# Patient Record
Sex: Male | Born: 1985 | Race: Black or African American | Hispanic: No | Marital: Single | Smoking: Never smoker
Health system: Southern US, Community
[De-identification: ages and names within clinical notes are randomized; demographics above are authoritative.]

---

## 2005-08-13 ENCOUNTER — Emergency Department: Payer: Self-pay | Admitting: Emergency Medicine

## 2012-04-02 ENCOUNTER — Emergency Department: Payer: Self-pay | Admitting: Emergency Medicine

## 2012-09-29 IMAGING — CR DG ANKLE COMPLETE 3+V*L*
1 series · 5 of 5 positions shown · non-contrast
Comparison: none

REASON FOR EXAM: pain, swelling s/p fall
COMMENTS:

PROCEDURE:     DXR - DXR ANKLE LEFT COMPLETE  - April 02, 2012  [DATE]
RESULT:     Five views of the left ankle reveal the joint mortise to be
preserved. The talar dome is intact. There is mild diffuse soft tissue
swelling. No malleolar fracture is demonstrated.

[Series 1: x ankle ap left · 0.14mm/px · 5 of 5 slices shown]
[im 1/5]
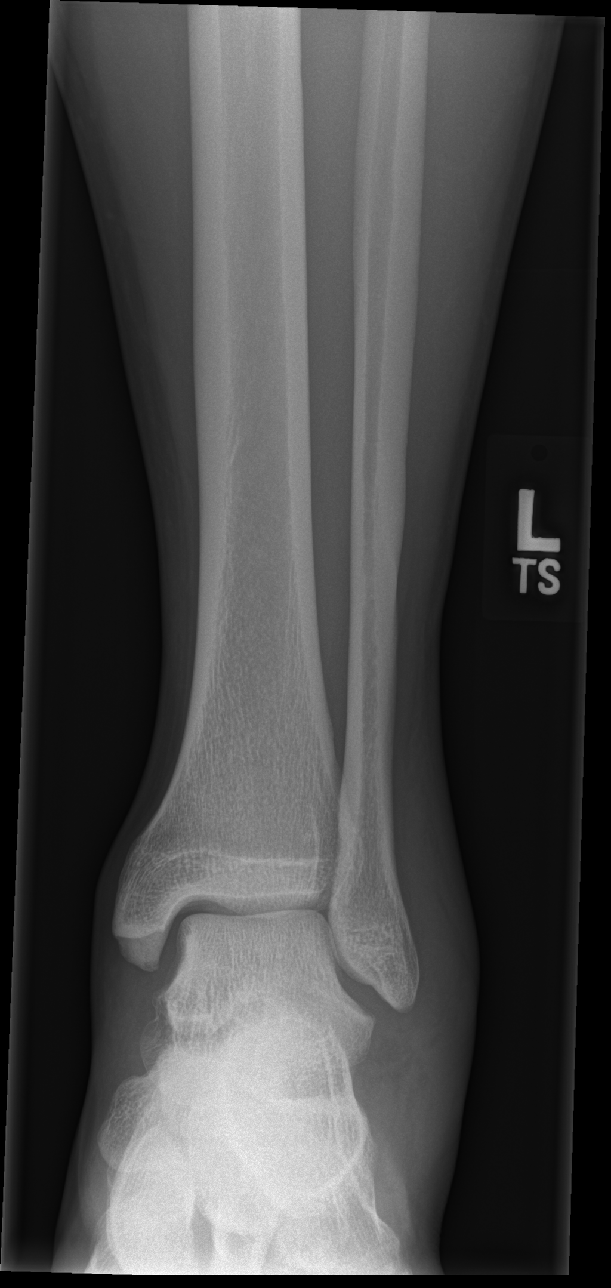
[im 2/5]
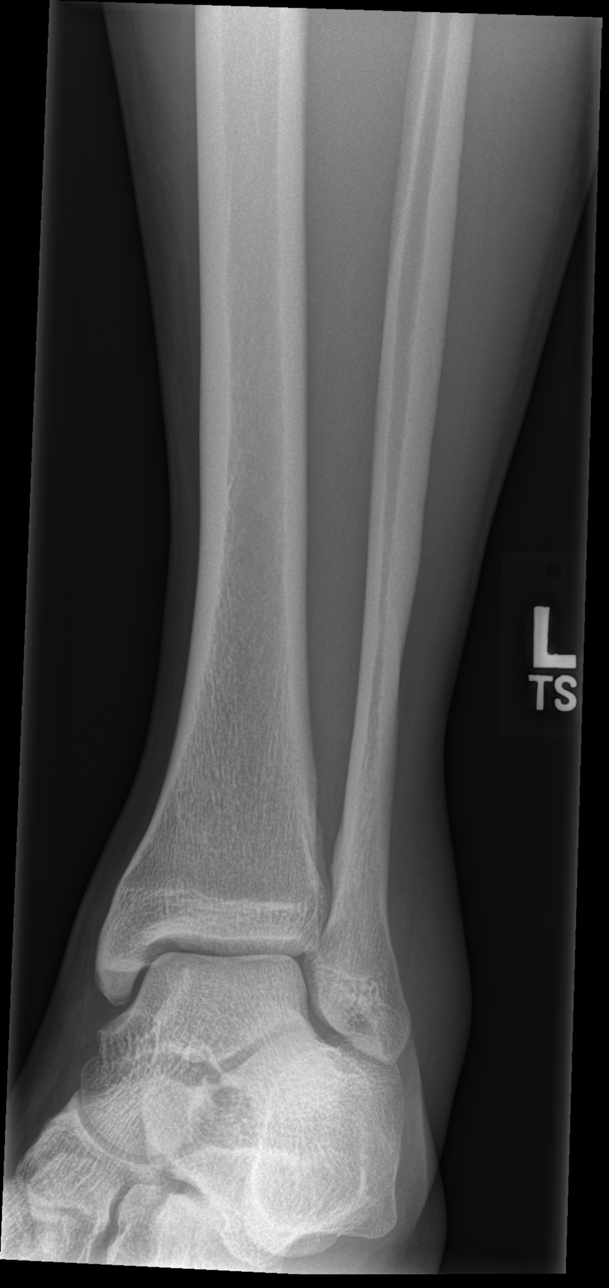
[im 3/5]
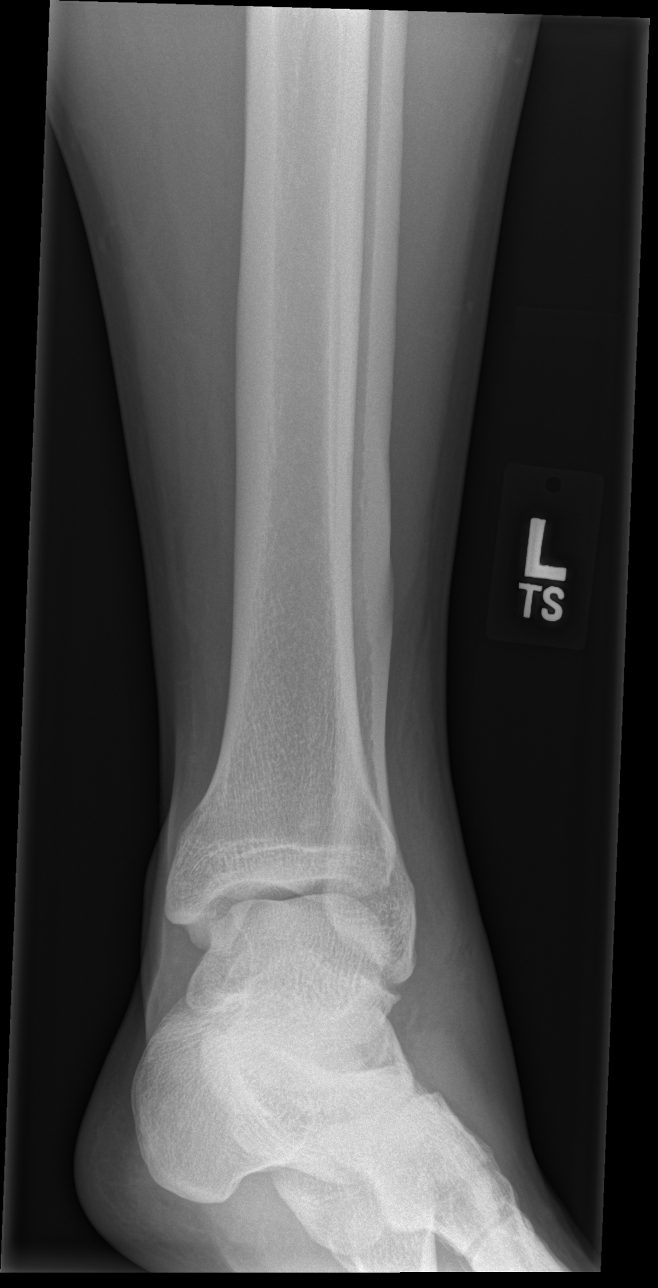
[im 4/5]
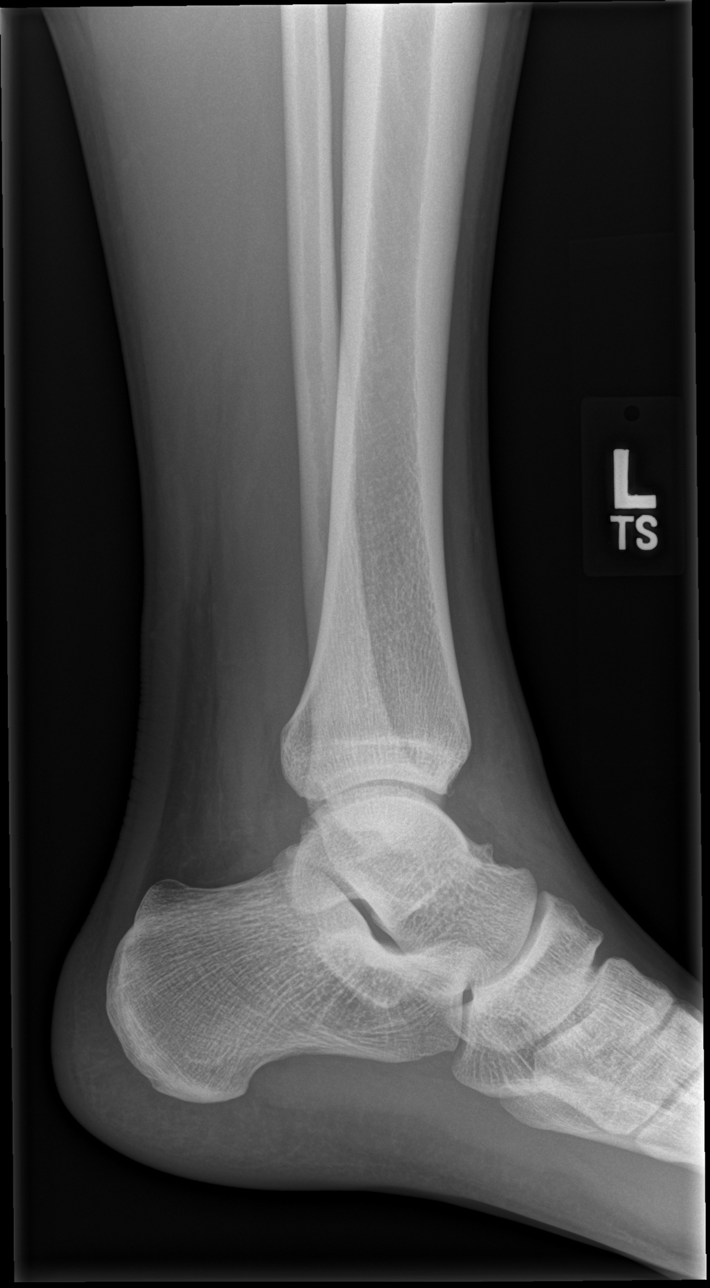
[im 5/5]
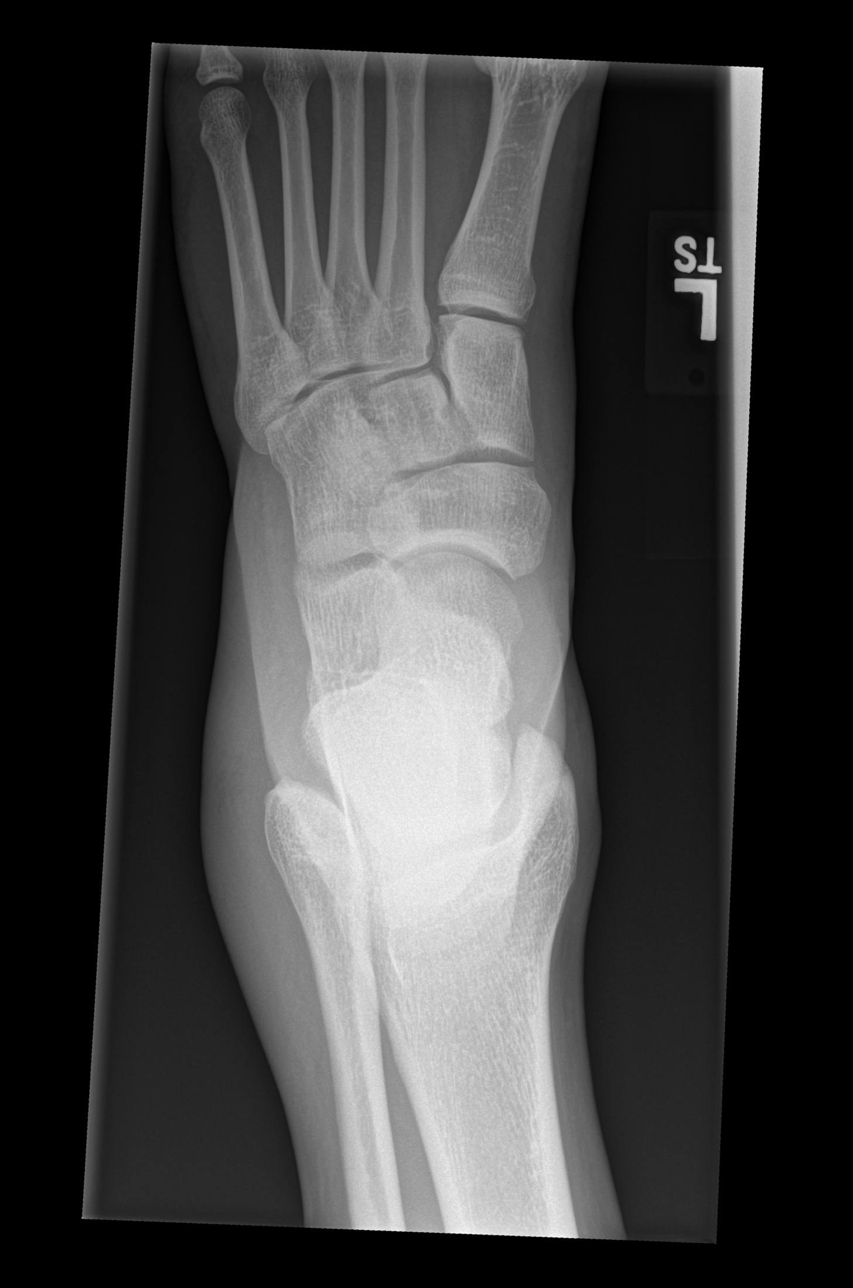

[5 of 5 positions shown; findings below may reference images not displayed]

IMPRESSION: There is soft tissue swelling over the ankle. There is no
evidence of an acute fracture. Followup imaging is available if the
patient's symptoms do not resolve in a fashion consistent with an
uncomplicated sprain or contusion.

## 2012-10-07 ENCOUNTER — Emergency Department: Payer: Self-pay | Admitting: Internal Medicine

## 2012-10-07 LAB — URINALYSIS, COMPLETE
Bilirubin,UR: NEGATIVE
Glucose,UR: NEGATIVE mg/dL (ref 0–75)
Ketone: NEGATIVE
Ph: 6 (ref 4.5–8.0)
RBC,UR: 1 /HPF (ref 0–5)
Specific Gravity: 1.017 (ref 1.003–1.030)
Squamous Epithelial: NONE SEEN
WBC UR: <1 /HPF (ref 0–5)

## 2012-10-11 ENCOUNTER — Emergency Department: Payer: Self-pay | Admitting: Emergency Medicine

## 2022-12-07 ENCOUNTER — Emergency Department
Admission: EM | Admit: 2022-12-07 | Discharge: 2022-12-07 | Disposition: A | Discharge status: Home / Self Care | Payer: BC Managed Care – PPO | Attending: Student in an Organized Health Care Education/Training Program | Admitting: Student in an Organized Health Care Education/Training Program

## 2022-12-07 ENCOUNTER — Ambulatory Visit
Admission: EM | Admit: 2022-12-07 | Discharge: 2022-12-07 | Disposition: A | Discharge status: Home / Self Care | Payer: BC Managed Care – PPO | Attending: Urgent Care | Admitting: Urgent Care

## 2022-12-07 ENCOUNTER — Other Ambulatory Visit: Payer: Self-pay

## 2022-12-07 ENCOUNTER — Emergency Department: Payer: BC Managed Care – PPO

## 2022-12-07 DIAGNOSIS — X500XXA Overexertion from strenuous movement or load, initial encounter: Secondary | ICD-10-CM | POA: Insufficient documentation

## 2022-12-07 DIAGNOSIS — S63501A Unspecified sprain of right wrist, initial encounter: Secondary | ICD-10-CM

## 2022-12-07 DIAGNOSIS — I161 Hypertensive emergency: Secondary | ICD-10-CM

## 2022-12-07 DIAGNOSIS — M25531 Pain in right wrist: Secondary | ICD-10-CM | POA: Insufficient documentation

## 2022-12-07 DIAGNOSIS — I10 Essential (primary) hypertension: Secondary | ICD-10-CM | POA: Insufficient documentation

## 2022-12-07 LAB — CBC
HCT: 46.7 % (ref 39.0–52.0)
Hemoglobin: 15.9 g/dL (ref 13.0–17.0)
MCH: 28.9 pg (ref 26.0–34.0)
MCHC: 34 g/dL (ref 30.0–36.0)
MCV: 84.9 fL (ref 80.0–100.0)
Platelets: 273 10*3/uL (ref 150–400)
RBC: 5.5 MIL/uL (ref 4.22–5.81)
RDW: 12.5 % (ref 11.5–15.5)
WBC: 6.1 10*3/uL (ref 4.0–10.5)
nRBC: 0 % (ref 0.0–0.2)

## 2022-12-07 LAB — COMPREHENSIVE METABOLIC PANEL
ALT: 60 U/L — ABNORMAL HIGH (ref 0–44)
AST: 32 U/L (ref 15–41)
Albumin: 4.4 g/dL (ref 3.5–5.0)
Alkaline Phosphatase: 57 U/L (ref 38–126)
Anion gap: 10 (ref 5–15)
BUN: 20 mg/dL (ref 6–20)
CO2: 21 mmol/L — ABNORMAL LOW (ref 22–32)
Calcium: 8.9 mg/dL (ref 8.9–10.3)
Chloride: 105 mmol/L (ref 98–111)
Creatinine, Ser: 1.38 mg/dL — ABNORMAL HIGH (ref 0.61–1.24)
GFR, Estimated: >60 mL/min (ref >60)
Glucose, Bld: 115 mg/dL — ABNORMAL HIGH (ref 70–99)
Potassium: 3.7 mmol/L (ref 3.5–5.1)
Sodium: 136 mmol/L (ref 135–145)
Total Bilirubin: 0.8 mg/dL (ref 0.3–1.2)
Total Protein: 8.2 g/dL — ABNORMAL HIGH (ref 6.5–8.1)

## 2022-12-07 LAB — TROPONIN I (HIGH SENSITIVITY): Troponin I (High Sensitivity): 6 ng/L (ref <18)

## 2022-12-07 MED ORDER — AMLODIPINE BESYLATE 5 MG PO TABS: 5.0000 mg | ORAL_TABLET | Freq: Every day | ORAL | 11 refills | Status: AC

## 2022-12-07 MED ORDER — OXYCODONE-ACETAMINOPHEN 5-325 MG PO TABS: 1.0000 | ORAL_TABLET | ORAL | 0 refills | Status: AC | PRN

## 2022-12-07 MED ORDER — ACETAMINOPHEN 500 MG PO TABS
1000.0000 mg | ORAL_TABLET | Freq: Once | ORAL | Status: AC
  Administered 2022-12-07: 1000 mg via ORAL
  Filled 2022-12-07: qty 2

## 2022-12-07 MED ORDER — CLONIDINE HCL 0.1 MG PO TABS
0.2000 mg | ORAL_TABLET | Freq: Once | ORAL | Status: AC
  Administered 2022-12-07: 0.2 mg via ORAL
  Filled 2022-12-07: qty 2

## 2022-12-07 NOTE — ED Notes (Addendum)
Patient is being discharged from the Urgent Care and sent to the Emergency Department via POV. Per Annie Main, NP, patient is in need of higher level of care due to hypertensive crisis . Patient is aware and verbalizes understanding of plan of care.  Vitals:   12/07/22 1119  BP: (!) 197/152  Pulse: (!) 107  Resp: 18  Temp: 99 F (37.2 C)  SpO2: 95%

## 2022-12-07 NOTE — ED Provider Notes (Signed)
[facility] Provider Note    Event Date/Time   First MD Initiated Contact with Patient 12/07/22 1318     (approximate)   History   Hypertension   HPI  Devin Mccarty is a 37 y.o. male no history of significant hypertension not on antihypertensive medication presents to the ER for evaluation of right wrist pain from urgent care due to elevated blood pressure.  Was complaining of right wrist pain and went to urgent care yesterday had splint placed no x-rays were performed told it was a strain or sprain.  Has had persistent right wrist pain.  The pain started after he was reaching behind him to lift a heavy bag and felt a popping sensation so there was report of trauma and it has been consistent pain since that injury.  Denies any chest pain or shortness of breath no headache.  No numbness or tingling.     Physical Exam   Triage Vital Signs: ED Triage Vitals  Enc Vitals Group     BP 12/07/22 1241 (!) 207/141     Pulse Rate 12/07/22 1240 (!) 121     Resp 12/07/22 1240 20     Temp 12/07/22 1240 98.2 F (36.8 C)     Temp Source 12/07/22 1240 Oral     SpO2 12/07/22 1240 96 %     Weight 12/07/22 1240 240 lb (108.9 kg)     Height 12/07/22 1240 6' (1.829 m)     Head Circumference --      Peak Flow --      Pain Score 12/07/22 1240 7     Pain Loc --      Pain Edu? --      Excl. in [place]? --     Most recent vital signs: Vitals:   12/07/22 1400 12/07/22 1500  BP: (!) 200/135 (!) 190/129  Pulse:  82  Resp: 16 14  Temp:    SpO2:  99%     Constitutional: Alert  Eyes: Conjunctivae are normal.  Head: Atraumatic. Nose: No congestion/rhinnorhea. Mouth/Throat: Mucous membranes are moist.   Neck: Painless ROM.  Cardiovascular:   Good peripheral circulation. Respiratory: Normal respiratory effort.  No retractions.  Gastrointestinal: Soft and nontender.  Musculoskeletal: Tenderness palpation mild amount of swelling over the distal right radius no  effusion neurovascular intact distally. Neurologic:  MAE spontaneously. No gross focal neurologic deficits are appreciated.  Skin:  Skin is warm, dry and intact. No rash noted. Psychiatric: Mood and affect are normal. Speech and behavior are normal.    ED Results / Procedures / Treatments   Labs (all labs ordered are listed, but only abnormal results are displayed) Labs Reviewed  COMPREHENSIVE METABOLIC PANEL - Abnormal; Notable for the following components:      Result Value   CO2 21 (*)    Glucose, Bld 115 (*)    Creatinine, Ser 1.38 (*)    Total Protein 8.2 (*)    ALT 60 (*)    All other components within normal limits  CBC  TROPONIN I (HIGH SENSITIVITY)     EKG  ED ECG REPORT I, Merlyn Lot, the attending physician, personally viewed and interpreted this ECG.   Date: 12/07/2022  EKG Time: 12:45  Rate: 120  Rhythm: sinus  Axis: normal  Intervals: normal  ST&T Change: nonspecific st abn, lvh, no stemi    RADIOLOGY Please see ED Course for my review and interpretation.  I personally reviewed all radiographic images ordered  to evaluate for the above acute complaints and reviewed radiology reports and findings.  These findings were personally discussed with the patient.  Please see medical record for radiology report.    PROCEDURES:  Critical Care performed: No  Procedures   MEDICATIONS ORDERED IN ED: Medications  cloNIDine (CATAPRES) tablet 0.2 mg (0.2 mg Oral Given 12/07/22 1342)  acetaminophen (TYLENOL) tablet 1,000 mg (1,000 mg Oral Given 12/07/22 1342)     IMPRESSION / MDM / ASSESSMENT AND PLAN / ED COURSE  I reviewed the triage vital signs and the nursing notes.                              Differential diagnosis includes, but is not limited to, central hypertension, hypertensive urgency, fracture, dislocation, ligamentous injury  Patient presenting to the ER for evaluation of symptoms as described above.  Based on symptoms, risk factors and  considered above differential, this presenting complaint could reflect a potentially life-threatening illness therefore the patient will be placed on continuous pulse oximetry and telemetry for monitoring.  Laboratory evaluation will be sent to evaluate for the above complaints.      Clinical Course as of 12/07/22 1513  Thu Dec 07, 2022  1354 X-ray wrist on my review and interpretation without evidence of fracture or dislocation. [PR]  1512 Ridging reassuring.  Blood pressure improving after p.o. medication.  Troponin negative.  He is not having any signs or symptoms of endorgan damage does appear appropriate for outpatient follow-up.  Discussed return precautions. [PR]    Clinical Course User Index [PR] Merlyn Lot, MD      FINAL CLINICAL IMPRESSION(S) / ED DIAGNOSES   Final diagnoses:  Wrist pain, acute, right  Hypertension, unspecified type     Rx / DC Orders   ED Discharge Orders          Ordered    amLODipine (NORVASC) 5 MG tablet  Daily        12/07/22 1511    oxyCODONE-acetaminophen (PERCOCET) 5-325 MG tablet  Every 4 hours PRN        12/07/22 1512             Note:  This document was prepared using Dragon voice recognition software and may include unintentional dictation errors.    Merlyn Lot, MD 12/07/22 [phone]

## 2022-12-07 NOTE — ED Triage Notes (Addendum)
Patient presents to [facility] for R wrist pain since yesterday. States he lifted his bag incorrectly. Took BC powder at 0800.

## 2022-12-07 NOTE — ED Notes (Signed)
Pt ambulated to lobby with all belongings and discharge papers, in NAD.

## 2022-12-07 NOTE — ED Triage Notes (Signed)
Pt here with hypertension. Pt states he went for a follow up for his wrist injury when the staff noticed his elevated bp. Pt states he has not been dx with htn but has a family hx of it.

## 2022-12-07 NOTE — ED Provider Notes (Addendum)
Devin Mccarty    CSN: [id number] Arrival date & time: 12/07/22  1047      History   Chief Complaint Chief Complaint  Patient presents with   Wrist Pain    HPI SAFAREE BERTE Mccarty is a 37 y.o. male.    Wrist Pain    Presents with right wrist pain starting yesterday following an injury when he was lifting a bag.  Reports using BC powder this morning at 8 AM.  Very elevated BP without diagnosis or treatment. Denies symptomatic. No HA or vision changes.  History reviewed. No pertinent past medical history.  There are no problems to display for this patient.   History reviewed. No pertinent surgical history.     Home Medications    Prior to Admission medications   Not on File    Family History History reviewed. No pertinent family history.  Social History Social History   Tobacco Use   Smoking status: Never   Smokeless tobacco: Never  Vaping Use   Vaping Use: Never used  Substance Use Topics   Alcohol use: Yes   Drug use: Never     Allergies   Patient has no known allergies.   Review of Systems Review of Systems   Physical Exam Triage Vital Signs ED Triage Vitals [12/07/22 1103]  Enc Vitals Group     BP      Pulse      Resp      Temp      Temp src      SpO2      Weight      Height      Head Circumference      Peak Flow      Pain Score 8     Pain Loc      Pain Edu?      Excl. in [place]?    No data found.  Updated Vital Signs There were no vitals taken for this visit.  Visual Acuity Right Eye Distance:   Left Eye Distance:   Bilateral Distance:    Right Eye Near:   Left Eye Near:    Bilateral Near:     Physical Exam Vitals reviewed.  Constitutional:      Appearance: Normal appearance.  Cardiovascular:     Rate and Rhythm: Normal rate and regular rhythm.     Pulses: Normal pulses.  Skin:    General: Skin is warm and dry.  Neurological:     General: No focal deficit present.     Mental Status: He is alert and  oriented to person, place, and time.  Psychiatric:        Mood and Affect: Mood normal.        Behavior: Behavior normal.      UC Treatments / Results  Labs (all labs ordered are listed, but only abnormal results are displayed) Labs Reviewed - No data to display  EKG   Radiology No results found.  Procedures Procedures (including critical care time)  Medications Ordered in UC Medications - No data to display  Initial Impression / Assessment and Plan / UC Course  I have reviewed the triage vital signs and the nursing notes.  Pertinent labs & imaging results that were available during my care of the patient were reviewed by me and considered in my medical decision making (see chart for details).   Likely sprain of the right wrist.  No x-ray today given mechanism of injury.  Fitted for right wrist  brace and advised to use cold therapy and compression as well as NSAID medication for pain relief.  Go to orthopedic provider if symptoms do not improve.  Rechecked blood pressure after a period of rest.  Continues elevated.  Asked patient to go to the ED for immediate evaluation of hypertensive emergency.  Final Clinical Impressions(s) / UC Diagnoses   Final diagnoses:  None   Discharge Instructions   None    ED Prescriptions   None    PDMP not reviewed this encounter.   Devin Mccarty, [place] 12/07/22 [place]    [place], [place] 12/07/22 1141

## 2022-12-07 NOTE — Discharge Instructions (Addendum)
Recommend you go to the emergency room immediately to have your blood pressure evaluated, receive emergency treatment.

## 2023-07-16 ENCOUNTER — Ambulatory Visit: Payer: Self-pay | Admitting: Cardiology

## 2023-08-28 ENCOUNTER — Ambulatory Visit: Payer: Self-pay | Admitting: Physician Assistant
